# Patient Record
Sex: Female | Born: 1990 | Hispanic: Yes | Marital: Married | Smoking: Never smoker
Health system: Southern US, Community
[De-identification: ages and names within clinical notes are randomized; demographics above are authoritative.]

---

## 2014-02-02 ENCOUNTER — Other Ambulatory Visit (HOSPITAL_COMMUNITY)
Admission: RE | Admit: 2014-02-02 | Discharge: 2014-02-02 | Disposition: A | Discharge status: Home / Self Care | Payer: Commercial Managed Care - PPO | Source: Ambulatory Visit | Attending: Family Medicine | Admitting: Family Medicine

## 2014-02-02 DIAGNOSIS — Z113 Encounter for screening for infections with a predominantly sexual mode of transmission: Secondary | ICD-10-CM | POA: Insufficient documentation

## 2014-02-02 DIAGNOSIS — Z124 Encounter for screening for malignant neoplasm of cervix: Secondary | ICD-10-CM | POA: Insufficient documentation

## 2014-02-03 ENCOUNTER — Other Ambulatory Visit: Payer: Self-pay | Admitting: Family Medicine

## 2014-02-03 DIAGNOSIS — T8332XA Displacement of intrauterine contraceptive device, initial encounter: Secondary | ICD-10-CM

## 2014-02-04 ENCOUNTER — Ambulatory Visit
Admission: RE | Admit: 2014-02-04 | Discharge: 2014-02-04 | Disposition: A | Discharge status: Home / Self Care | Payer: 59 | Source: Ambulatory Visit | Attending: Family Medicine | Admitting: Family Medicine

## 2014-02-04 ENCOUNTER — Ambulatory Visit
Admission: RE | Admit: 2014-02-04 | Discharge: 2014-02-04 | Disposition: A | Discharge status: Home / Self Care | Payer: Commercial Managed Care - PPO | Source: Ambulatory Visit | Attending: Family Medicine | Admitting: Family Medicine

## 2014-02-04 DIAGNOSIS — T8332XA Displacement of intrauterine contraceptive device, initial encounter: Secondary | ICD-10-CM

## 2014-02-08 ENCOUNTER — Other Ambulatory Visit: Payer: Self-pay

## 2015-07-20 IMAGING — US US TRANSVAGINAL NON-OB
1 series · 14 of 25 positions shown · non-contrast
Comparison: None

CLINICAL DATA: Question of IUD placement. Unable to see or feel the
string. LMP 01/17/2014.



[Series 1: us transvaginal non-ob · 0.24mm/px · 14 of 63 slices shown]
[im 1/63]
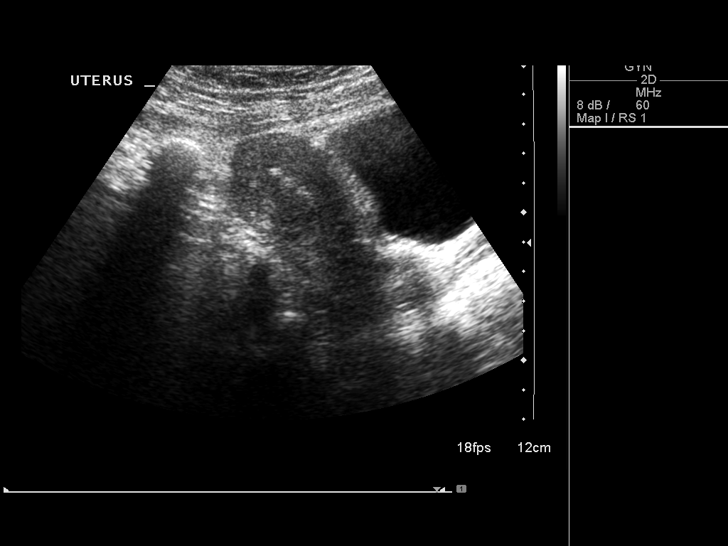
[im 6/63]
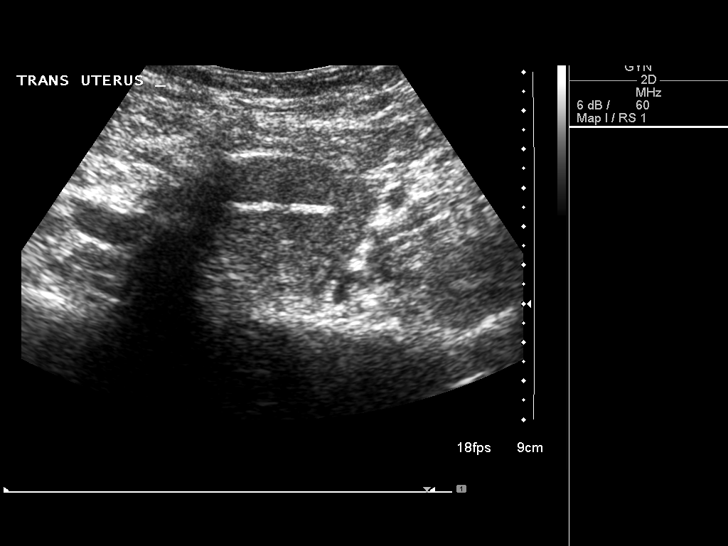
[im 11/63]
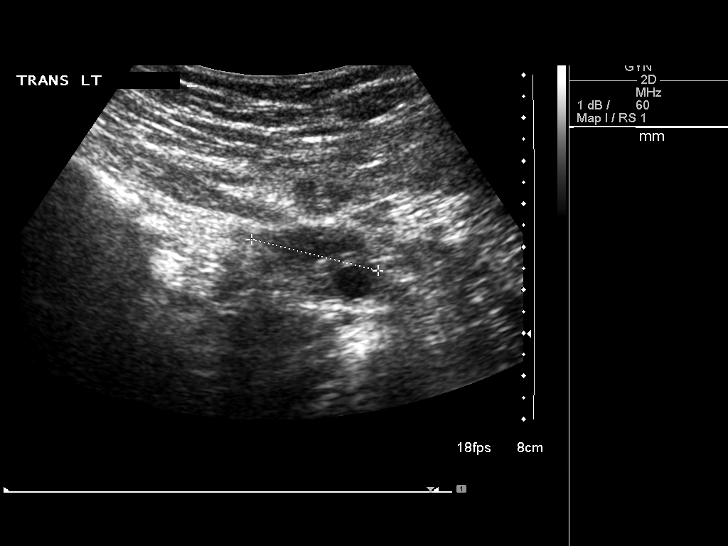
[im 16/63]
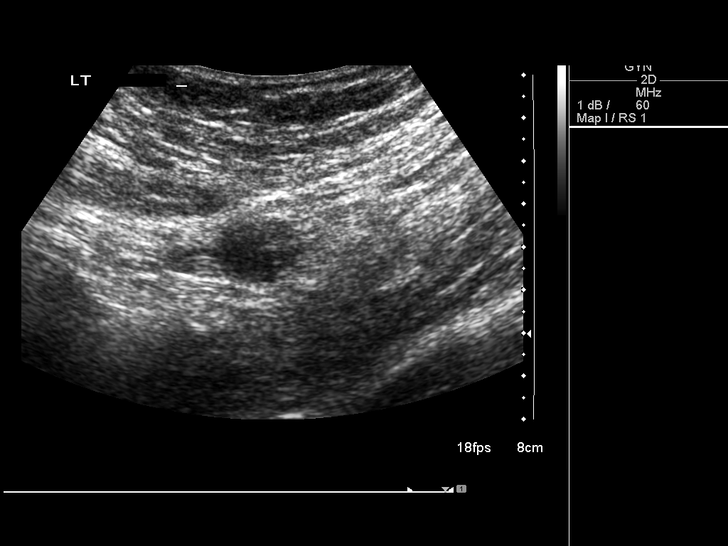
[im 21/63]
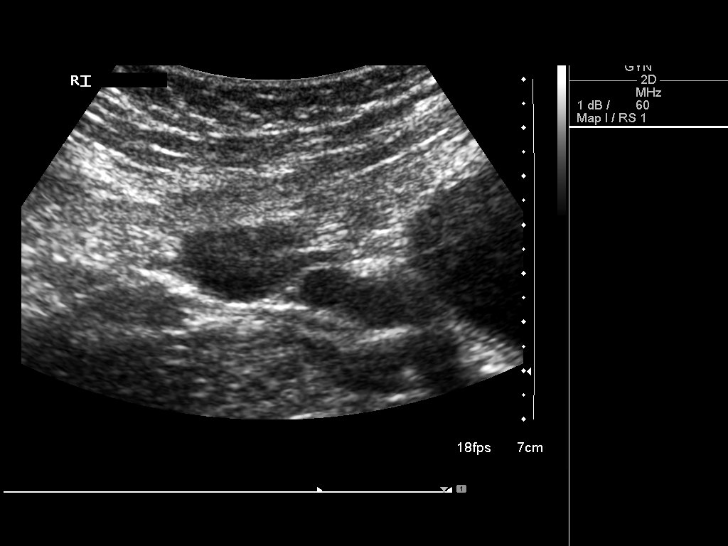
[im 24/63]
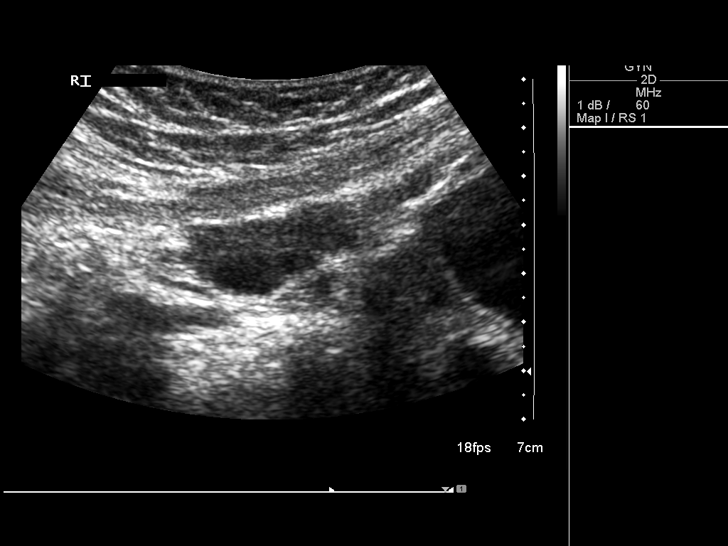
[im 29/63]
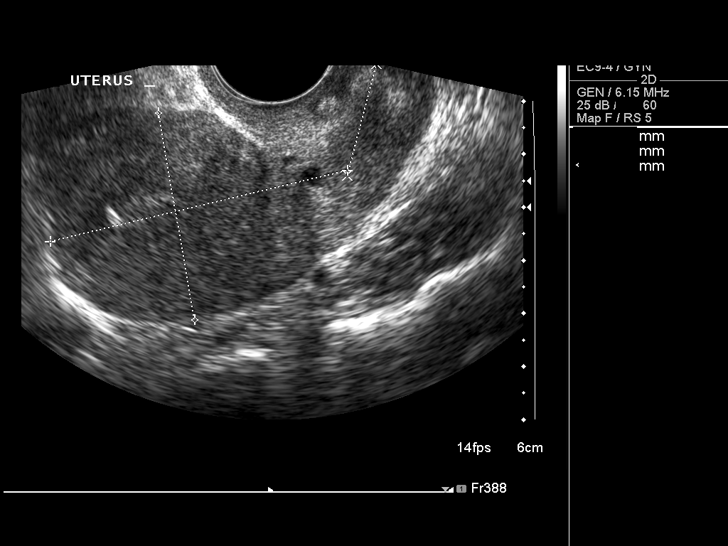
[im 34/63]
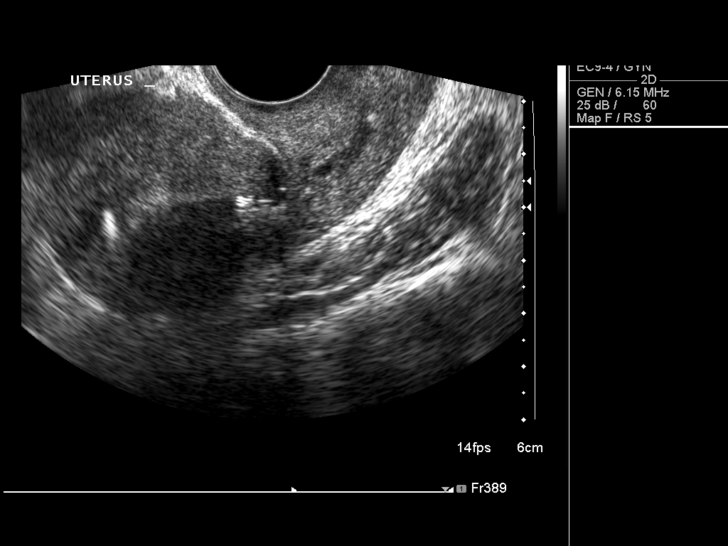
[im 39/63]
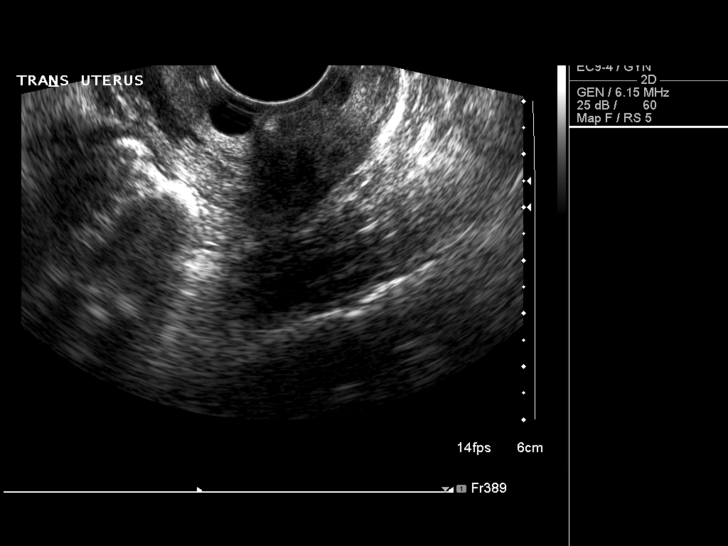
[im 42/63]
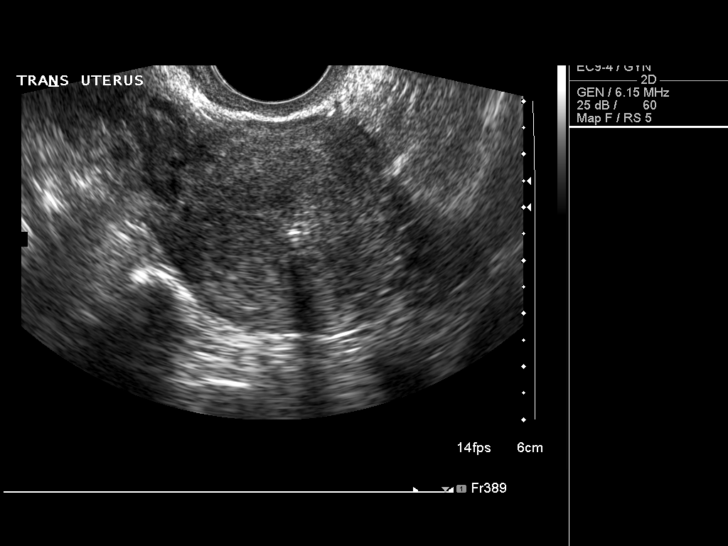
[im 47/63]
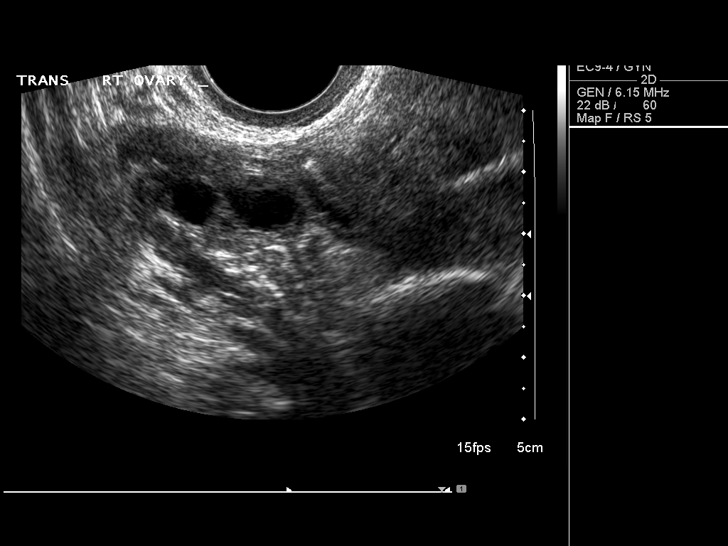
[im 52/63]
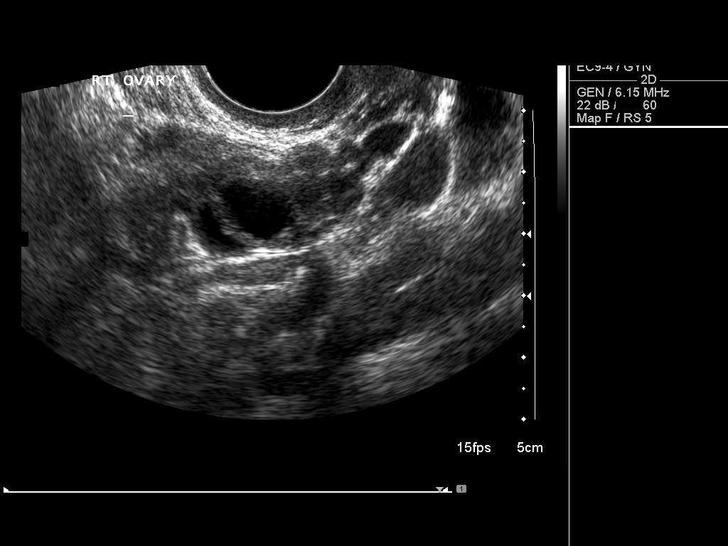
[im 57/63]
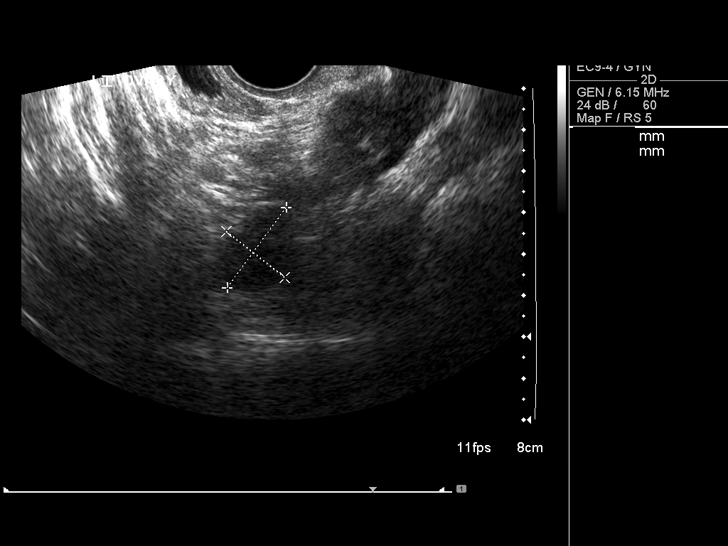
[im 63/63]
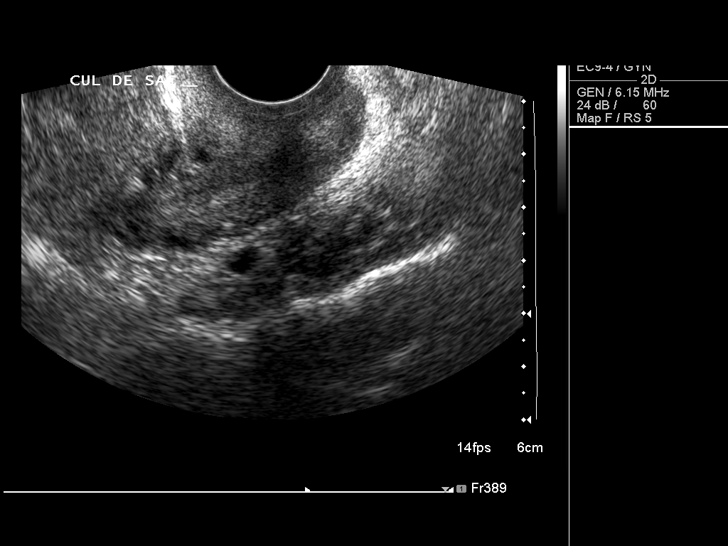

[14 of 25 positions shown; findings below may reference images not displayed]

FINDINGS: Uterus

Measurements: 7.9 x 4.0 x 4.9 cm. No fibroids or other mass
visualized.

Endometrium

Thickness: 5.9 mm. Intrauterine device is identified in the
endometrial cavity.

Right ovary

Measurements: 3.0 x 2.4 x 3.0 cm. Normal appearance/no adnexal mass.

Left ovary

Measurements: 3.3 x 1.7 x 3.0 cm. Normal appearance/no adnexal mass.

Other findings

No free fluid.
IMPRESSION: 1. Intrauterine device identified within the endometrial canal as
expected. No evidence for malposition.
2. Normal appearance of the ovaries.

## 2016-01-08 ENCOUNTER — Ambulatory Visit (INDEPENDENT_AMBULATORY_CARE_PROVIDER_SITE_OTHER): Payer: 59 | Admitting: Podiatry

## 2016-01-08 ENCOUNTER — Encounter: Payer: Self-pay | Admitting: Podiatry

## 2016-01-08 VITALS — BP 109/67 | HR 57 | Resp 16 | Ht <= 58 in | Wt 134.0 lb

## 2016-01-08 DIAGNOSIS — B079 Viral wart, unspecified: Secondary | ICD-10-CM | POA: Diagnosis not present

## 2016-01-08 DIAGNOSIS — B078 Other viral warts: Secondary | ICD-10-CM

## 2016-01-08 NOTE — Progress Notes (Signed)
   Subjective:    Patient ID: Alexandria Lewis, female    DOB: Jul 25, 1991, 25 y.o.   MRN: [id number]  HPI Patient presents with warts on their left foot; plantar forefoot-medial below 1st toe; midfoot-lateral side near heel; x1 yr.  Patient has used OTC medication with no relief. Pt also stated, "Had warts frozen off from their primary care physician last September 2016, but they never went away".   Review of Systems  All other systems reviewed and are negative.      Objective:   Physical Exam        Assessment & Plan:

## 2016-01-09 NOTE — Progress Notes (Signed)
Subjective:     Patient ID: Alexandria Lewis, female   DOB: 19-Oct-1991, 25 y.o.   MRN: [id number]  HPI patient presents with pain in the medial side of the left foot and also slightly distal to this stating that the lesion has been there for around a year and it's very tender. She has tried to have them frozen by her family physician and has tried over-the-counter medicines without results   Review of Systems  All other systems reviewed and are negative.      Objective:   Physical Exam  Constitutional: She is oriented to person, place, and time.  Cardiovascular: Intact distal pulses.   Musculoskeletal: Normal range of motion.  Neurological: She is oriented to person, place, and time.  Skin: Skin is warm.  Nursing note and vitals reviewed.  neurovascular status intact muscle strength adequate range of motion within normal limits with patient found to have several lesions plantar aspect left that upon debridement show pinpoint bleeding and pain to lateral pressure. They appear to be localized with no spread noted in any other area of the foot or lower legs. 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 Debrided the lesions today and applied chemical agent to destroy and create an immune response. Reappoint to recheck

## 2016-02-05 ENCOUNTER — Ambulatory Visit (INDEPENDENT_AMBULATORY_CARE_PROVIDER_SITE_OTHER): Payer: 59 | Admitting: Podiatry

## 2016-02-05 ENCOUNTER — Encounter: Payer: Self-pay | Admitting: Podiatry

## 2016-02-05 DIAGNOSIS — B079 Viral wart, unspecified: Secondary | ICD-10-CM

## 2016-02-05 DIAGNOSIS — B078 Other viral warts: Secondary | ICD-10-CM

## 2016-02-06 NOTE — Progress Notes (Signed)
Subjective:     Patient ID: Alexandria Lewis, female   DOB: Jan 07, 1991, 25 y.o.   MRN: [id number]  HPI patient presents stating the lesions are improving but I still feel like they're present   Review of Systems     Objective:   Physical Exam Neurovascular status intact with lesion on the plantar aspect foot and heel that are improving with pinpoint bleeding still noted and pain to lateral pressure    Assessment:     Verruca plantaris still present but improving    Plan:     Debridement of lesions accomplished and applied chemical to create an immune response with sterile dressings applied. Explain what to do if any kind of drainage or blistering were to occur
